# Patient Record
Sex: Female | Born: 1950 | Race: White | Hispanic: No | Smoking: Never smoker
Health system: Southern US, Community
[De-identification: ages and names within clinical notes are randomized; demographics above are authoritative.]

## PROBLEM LIST (undated history)

## (undated) DIAGNOSIS — K219 Gastro-esophageal reflux disease without esophagitis: Secondary | ICD-10-CM

## (undated) DIAGNOSIS — C801 Malignant (primary) neoplasm, unspecified: Secondary | ICD-10-CM

## (undated) DIAGNOSIS — M359 Systemic involvement of connective tissue, unspecified: Secondary | ICD-10-CM

## (undated) DIAGNOSIS — F329 Major depressive disorder, single episode, unspecified: Secondary | ICD-10-CM

## (undated) DIAGNOSIS — R569 Unspecified convulsions: Secondary | ICD-10-CM

## (undated) DIAGNOSIS — E7800 Pure hypercholesterolemia, unspecified: Secondary | ICD-10-CM

## (undated) DIAGNOSIS — F419 Anxiety disorder, unspecified: Secondary | ICD-10-CM

## (undated) DIAGNOSIS — M1990 Unspecified osteoarthritis, unspecified site: Secondary | ICD-10-CM

## (undated) DIAGNOSIS — B1920 Unspecified viral hepatitis C without hepatic coma: Secondary | ICD-10-CM

## (undated) DIAGNOSIS — F32A Depression, unspecified: Secondary | ICD-10-CM

## (undated) DIAGNOSIS — H409 Unspecified glaucoma: Secondary | ICD-10-CM

## (undated) DIAGNOSIS — M797 Fibromyalgia: Secondary | ICD-10-CM

## (undated) DIAGNOSIS — I10 Essential (primary) hypertension: Secondary | ICD-10-CM

## (undated) DIAGNOSIS — E559 Vitamin D deficiency, unspecified: Secondary | ICD-10-CM

## (undated) HISTORY — DX: Pure hypercholesterolemia, unspecified: E78.00

## (undated) HISTORY — DX: Essential (primary) hypertension: I10

## (undated) HISTORY — DX: Unspecified glaucoma: H40.9

## (undated) HISTORY — DX: Gastro-esophageal reflux disease without esophagitis: K21.9

## (undated) HISTORY — DX: Unspecified osteoarthritis, unspecified site: M19.90

## (undated) HISTORY — DX: Major depressive disorder, single episode, unspecified: F32.9

## (undated) HISTORY — DX: Unspecified convulsions: R56.9

## (undated) HISTORY — DX: Vitamin D deficiency, unspecified: E55.9

## (undated) HISTORY — DX: Unspecified viral hepatitis C without hepatic coma: B19.20

## (undated) HISTORY — DX: Fibromyalgia: M79.7

## (undated) HISTORY — PX: CHOLECYSTECTOMY: SHX55

## (undated) HISTORY — PX: TONSILLECTOMY: SHX5217

## (undated) HISTORY — DX: Depression, unspecified: F32.A

## (undated) HISTORY — PX: NOSE SURGERY: SHX723

## (undated) HISTORY — PX: ABDOMINAL HYSTERECTOMY: SHX81

## (undated) HISTORY — DX: Malignant (primary) neoplasm, unspecified: C80.1

## (undated) HISTORY — DX: Anxiety disorder, unspecified: F41.9

---

## 2016-09-17 ENCOUNTER — Other Ambulatory Visit: Payer: Self-pay | Admitting: Internal Medicine

## 2016-09-17 DIAGNOSIS — Z1231 Encounter for screening mammogram for malignant neoplasm of breast: Secondary | ICD-10-CM

## 2016-10-05 ENCOUNTER — Encounter: Payer: Self-pay | Admitting: Radiology

## 2016-10-05 ENCOUNTER — Ambulatory Visit
Admission: RE | Admit: 2016-10-05 | Discharge: 2016-10-05 | Disposition: A | Discharge status: Home / Self Care | Payer: Medicare Other | Source: Ambulatory Visit | Attending: Internal Medicine | Admitting: Internal Medicine

## 2016-10-05 DIAGNOSIS — Z1231 Encounter for screening mammogram for malignant neoplasm of breast: Secondary | ICD-10-CM | POA: Insufficient documentation

## 2016-10-09 ENCOUNTER — Inpatient Hospital Stay
Admission: RE | Admit: 2016-10-09 | Discharge: 2016-10-09 | Disposition: A | Discharge status: Home / Self Care | Payer: Self-pay | Source: Ambulatory Visit | Attending: *Deleted | Admitting: *Deleted

## 2016-10-09 ENCOUNTER — Other Ambulatory Visit: Payer: Self-pay | Admitting: *Deleted

## 2016-10-09 DIAGNOSIS — Z9289 Personal history of other medical treatment: Secondary | ICD-10-CM

## 2017-04-05 ENCOUNTER — Ambulatory Visit (INDEPENDENT_AMBULATORY_CARE_PROVIDER_SITE_OTHER): Payer: Medicare Other | Admitting: Urology

## 2017-04-05 ENCOUNTER — Encounter: Payer: Self-pay | Admitting: Urology

## 2017-04-05 VITALS — BP 128/90 | HR 147 | Ht 66.0 in | Wt 217.0 lb

## 2017-04-05 DIAGNOSIS — R310 Gross hematuria: Secondary | ICD-10-CM | POA: Diagnosis not present

## 2017-04-05 DIAGNOSIS — N200 Calculus of kidney: Secondary | ICD-10-CM | POA: Diagnosis not present

## 2017-04-05 LAB — URINALYSIS, COMPLETE
BILIRUBIN UA: NEGATIVE
GLUCOSE, UA: NEGATIVE
Leukocytes, UA: NEGATIVE
Nitrite, UA: NEGATIVE
RBC, UA: NEGATIVE
Specific Gravity, UA: >1.03 — ABNORMAL HIGH (ref 1.005–1.030)
Urobilinogen, Ur: 0.2 mg/dL (ref 0.2–1.0)
pH, UA: 5.5 (ref 5.0–7.5)

## 2017-04-05 LAB — MICROSCOPIC EXAMINATION
Bacteria, UA: NONE SEEN
RBC, UA: NONE SEEN /hpf (ref 0–?)
WBC, UA: NONE SEEN /hpf (ref 0–?)

## 2017-04-05 NOTE — Progress Notes (Signed)
04/05/2017 10:21 AM   Becky Collier 1950/12/16 [id number]  Referring provider: Glendon Axe, MD [facility] [address] [place], [place] [address]  Chief Complaint  Patient presents with  . Hematuria    HPI: The patient is a 67 year old female with past medical history of nephrolithiasis requiring multiple stone related surgery since 2004 presents today to discuss this history of kidney stones.  Approximately 3 weeks ago, the patient had significant gross hematuria as well as right flank pain.  She thinks she may have been passing a stone.  She had no imaging at that time.  She never did see the stone.  She has had multiple ureteroscopic intervention since 2004.  It is been sometime though since her last one.  She also did experiences gross hematuria which is concerning.  This is not common for her.  This has since resolved.  Urinalysis today is without blood but does have calcium oxalate crystals.  She has never smoked.  Stone composition is unknown per patient.  She has never had a metabolic workup despite her frequent stone formation.  She is on Topomax.  PMH: Past Medical History:  Diagnosis Date  . Acid reflux   . Anxiety   . Arthritis   . Cancer (HCC)    cervial  . Depression   . Diabetes mellitus without complication ([place])   . Fibromyalgia   . Glaucoma   . Hepatitis C   . High cholesterol   . Hypertension   . Seizure ([place])   . Vitamin D deficiency     Surgical History: Past Surgical History:  Procedure Laterality Date  . ABDOMINAL HYSTERECTOMY    . CHOLECYSTECTOMY    . NOSE SURGERY    . TONSILLECTOMY      Home Medications:  Allergies as of 04/05/2017      Reactions   Codeine Nausea Only   (CODEINE)   Propoxyphene Other (See Comments)   (DARVOCET-N 50) (DARVOCET-N 50)      Medication List        Accurate as of 04/05/17 10:21 AM. Always use your most recent med list.          amoxicillin 500 MG capsule Commonly known as:   AMOXIL TAKE 4 CAPSULES BY MOUTH 1 HOUR PRIOR TO DENTAL PROCEDURE   cycloSPORINE 0.05 % ophthalmic emulsion Commonly known as:  RESTASIS Apply to eye.   ENBREL 50 MG/ML injection Generic drug:  etanercept   escitalopram 20 MG tablet Commonly known as:  LEXAPRO Take by mouth.   lisinopril 40 MG tablet Commonly known as:  PRINIVIL,ZESTRIL TAKE 1 TABLET EVERY DAY   meclizine 25 MG tablet Commonly known as:  ANTIVERT Take by mouth.   meloxicam 7.5 MG tablet Commonly known as:  MOBIC TAKE 1 TO 2 TABLETS BY MOUTH DAILY AS NEEDED FOR PAIN   pravastatin 40 MG tablet Commonly known as:  PRAVACHOL Take 40 mg by mouth daily.   PRESERVISION AREDS 2+MULTI VIT Caps Take 1 capsule by mouth daily.   sulfaSALAzine 500 MG EC tablet Commonly known as:  AZULFIDINE   tiZANidine 2 MG tablet Commonly known as:  ZANAFLEX Take by mouth.   topiramate 50 MG tablet Commonly known as:  TOPAMAX Take 50 mg by mouth 2 (two) times daily.   traZODone 150 MG tablet Commonly known as:  DESYREL Take 150 mg by mouth at bedtime.   Vitamin D (Ergocalciferol) 50000 units Caps capsule Commonly known as:  DRISDOL TAKE ONE CAPSULE ONCE A WEEK  FOR 12 WEEKS       Allergies:  Allergies  Allergen Reactions  . Codeine Nausea Only    (CODEINE)   . Propoxyphene Other (See Comments)    (DARVOCET-N 5) (DARVOCET-N 50)    Family History: Family History  Problem Relation Age of Onset  . Breast cancer Mother        67's    Social History:  reports that  has never smoked. she has never used smokeless tobacco. She reports that she does not drink alcohol or use drugs.  ROS: UROLOGY Frequent Urination?: Yes Hard to postpone urination?: Yes Burning/pain with urination?: No Get up at night to urinate?: Yes Leakage of urine?: Yes Urine stream starts and stops?: No Trouble starting stream?: Yes Do you have to strain to urinate?: Yes Blood in urine?: No Urinary tract infection?: No Sexually  transmitted disease?: No Injury to kidneys or bladder?: No Painful intercourse?: No Weak stream?: Yes Currently pregnant?: No Vaginal bleeding?: No Last menstrual period?: n  Gastrointestinal Nausea?: Yes Vomiting?: Yes Indigestion/heartburn?: No Diarrhea?: Yes Constipation?: No  Constitutional Fever: Yes Night sweats?: No Weight loss?: Yes Fatigue?: Yes  Skin Skin rash/lesions?: No Itching?: No  Eyes Blurred vision?: Yes Double vision?: No  Ears/Nose/Throat Sore throat?: No Sinus problems?: No  Hematologic/Lymphatic Swollen glands?: Yes Easy bruising?: No  Cardiovascular Leg swelling?: No Chest pain?: No  Respiratory Cough?: Yes Shortness of breath?: No  Endocrine Excessive thirst?: Yes  Musculoskeletal Back pain?: Yes Joint pain?: Yes  Neurological Headaches?: Yes Dizziness?: Yes  Psychologic Depression?: Yes Anxiety?: Yes  Physical Exam: BP 128/90   Pulse (!) 147   Ht 5\' 6"  (1.676 m)   Wt 217 lb (98.4 kg)   BMI 35.02 kg/m   Constitutional:  Alert and oriented, No acute distress. HEENT: [place] AT, moist mucus membranes.  Trachea midline, no masses. Cardiovascular: No clubbing, cyanosis, or edema. Respiratory: Normal respiratory effort, no increased work of breathing. GI: Abdomen is soft, nontender, nondistended, no abdominal masses GU: No CVA tenderness.  Skin: No rashes, bruises or suspicious lesions. Lymph: No cervical or inguinal adenopathy. Neurologic: Grossly intact, no focal deficits, moving all 4 extremities. Psychiatric: Normal mood and affect.  Laboratory Data: No results found for: WBC, HGB, HCT, MCV, PLT  No results found for: CREATININE  No results found for: PSA  No results found for: TESTOSTERONE  No results found for: HGBA1C  Urinalysis No results found for: COLORURINE, APPEARANCEUR, LABSPEC, PHURINE, GLUCOSEU, HGBUR, BILIRUBINUR, KETONESUR, PROTEINUR, UROBILINOGEN, NITRITE, LEUKOCYTESUR   Assessment & Plan:     1. Gross hematuria I discussed the patient that she should undergo formal gross hematuria workup as she did have this.  Though she was having a possible stone passage that time, the fact that she never saw a stone concerns me.  We will arrange for her to undergo a CT hematuria protocol followed by office cystoscopy.    2.  History of nephrolithiasis She does have calcium oxalate in her urine today.  If we can determine her stone type, she would benefit from a 24-hour urine study and metabolic workup.  The CT for above will help [place] assess her current stone burden if any.  I did discuss the patient that Topamax puts her at risk for kidney stones.   I encouraged her to discuss this medication with the prescribing physician to see if there is an alternative as this is likely contributing to her recurrent stone formation.  Return for after CT for cysto.  Aaron Edelman  Harolyn Rutherford, MD  [facility] [address], [place], [place] [address] [phone]

## 2017-04-16 ENCOUNTER — Ambulatory Visit
Admission: RE | Admit: 2017-04-16 | Discharge: 2017-04-16 | Disposition: A | Discharge status: Home / Self Care | Payer: Medicare Other | Source: Ambulatory Visit | Attending: Urology | Admitting: Urology

## 2017-04-16 DIAGNOSIS — I2510 Atherosclerotic heart disease of native coronary artery without angina pectoris: Secondary | ICD-10-CM | POA: Insufficient documentation

## 2017-04-16 DIAGNOSIS — K5730 Diverticulosis of large intestine without perforation or abscess without bleeding: Secondary | ICD-10-CM | POA: Insufficient documentation

## 2017-04-16 DIAGNOSIS — N281 Cyst of kidney, acquired: Secondary | ICD-10-CM | POA: Diagnosis not present

## 2017-04-16 DIAGNOSIS — M47816 Spondylosis without myelopathy or radiculopathy, lumbar region: Secondary | ICD-10-CM | POA: Diagnosis not present

## 2017-04-16 DIAGNOSIS — N200 Calculus of kidney: Secondary | ICD-10-CM | POA: Insufficient documentation

## 2017-04-16 DIAGNOSIS — R310 Gross hematuria: Secondary | ICD-10-CM | POA: Diagnosis not present

## 2017-04-16 HISTORY — DX: Systemic involvement of connective tissue, unspecified: M35.9

## 2017-04-16 LAB — POCT I-STAT CREATININE: Creatinine, Ser: 0.9 mg/dL (ref 0.44–1.00)

## 2017-04-16 MED ORDER — IOPAMIDOL (ISOVUE-300) INJECTION 61%
125.0000 mL | Freq: Once | INTRAVENOUS | Status: AC | PRN
  Administered 2017-04-16: 125 mL via INTRAVENOUS

## 2017-04-19 ENCOUNTER — Ambulatory Visit (INDEPENDENT_AMBULATORY_CARE_PROVIDER_SITE_OTHER): Payer: Medicare Other | Admitting: Urology

## 2017-04-19 ENCOUNTER — Encounter: Payer: Self-pay | Admitting: Urology

## 2017-04-19 VITALS — BP 120/76 | HR 68 | Ht 66.0 in | Wt 217.1 lb

## 2017-04-19 DIAGNOSIS — R310 Gross hematuria: Secondary | ICD-10-CM | POA: Diagnosis not present

## 2017-04-19 DIAGNOSIS — N200 Calculus of kidney: Secondary | ICD-10-CM

## 2017-04-19 LAB — URINALYSIS, COMPLETE
Bilirubin, UA: NEGATIVE
GLUCOSE, UA: NEGATIVE
KETONES UA: NEGATIVE
Leukocytes, UA: NEGATIVE
NITRITE UA: NEGATIVE
RBC, UA: NEGATIVE
UUROB: 0.2 mg/dL (ref 0.2–1.0)
pH, UA: 5 (ref 5.0–7.5)

## 2017-04-19 LAB — MICROSCOPIC EXAMINATION
RBC MICROSCOPIC, UA: NONE SEEN /HPF (ref 0–?)
WBC UA: NONE SEEN /HPF (ref 0–?)

## 2017-04-19 MED ORDER — LIDOCAINE HCL 2 % EX GEL
1.0000 "application " | Freq: Once | CUTANEOUS | Status: AC
  Administered 2017-04-19: 1 via URETHRAL

## 2017-04-19 MED ORDER — CIPROFLOXACIN HCL 500 MG PO TABS
500.0000 mg | ORAL_TABLET | Freq: Once | ORAL | Status: AC
  Administered 2017-04-19: 500 mg via ORAL

## 2017-04-19 NOTE — Progress Notes (Signed)
   04/19/17  CC:  Chief Complaint  Patient presents with  . Cysto    HPI: The patient is a 67 year old female with past medical history of nephrolithiasis requiring multiple stone related surgery since 2004 presents today to discuss this history of kidney stones.  Approximately 3 weeks ago, the patient had significant gross hematuria as well as right flank pain.  She thinks she may have been passing a stone.  She had no imaging at that time.  She never did see the stone.  She has had multiple ureteroscopic intervention since 2004.  It is been sometime though since her last one.  She also did experiences gross hematuria which is concerning.  This is not common for her.  This has since resolved.  Urinalysis today is without blood but does have calcium oxalate crystals.  She has never smoked.  Stone composition is unknown per patient.  She has never had a metabolic workup despite her frequent stone formation.  She is on Topomax.  The patient also complains of vague right flank pain at this time.  This is been here for a number of months.  She  is concerned that her known right renal cyst as a source of this.  Due to her gross hematuria which she does not typically have a stone passes, she underwent a CT hematuria protocol.  This showed a 3 mm left upper pole stone that was nonobstructing.  It also showed a 1.7 cm right benign lower pole cyst.  There were no vitals taken for this visit. NED. A&Ox3.   No respiratory distress   Abd soft, NT, ND Normal external genitalia with patent urethral meatus  Cystoscopy Procedure Note  Patient identification was confirmed, informed consent was obtained, and patient was prepped using Betadine solution.  Lidocaine jelly was administered per urethral meatus.    Preoperative abx where received prior to procedure.    Procedure: - Flexible cystoscope introduced, without any difficulty.   - Thorough search of the bladder revealed:    normal urethral  meatus    normal urothelium    no stones    no ulcers     no tumors    no urethral polyps    no trabeculation  - Ureteral orifices were normal in position and appearance.  Post-Procedure: - Patient tolerated the procedure well  Assessment/ Plan:  1.  Gross hematuria Negative workup except for 3 mm left upper pole stone. Likely from her recent stone passage.  2.  3 mm left upper pole nonobstructing stone This is a small stone.  We will treated conservatively for now.  3.  Recurrent nephrolithiasis Once we know her stone composition, the patient would benefit from a 24-hour urine study.  We will attempt to obtain a stone for stone analysis if she passes one in the future.  4.  Right flank pain I reassured the patient that her flank pain is not related to her very small renal cyst this is a very common incidental finding that is nonpainful.  Do not believe this discomfort be genitourinary in origin.  Favor musculoskeletal.  Nickie Retort, MD

## 2017-10-10 ENCOUNTER — Other Ambulatory Visit: Payer: Self-pay | Admitting: Internal Medicine

## 2017-10-10 DIAGNOSIS — Z1231 Encounter for screening mammogram for malignant neoplasm of breast: Secondary | ICD-10-CM

## 2017-10-25 ENCOUNTER — Ambulatory Visit
Admission: RE | Admit: 2017-10-25 | Discharge: 2017-10-25 | Disposition: A | Discharge status: Home / Self Care | Payer: Medicare Other | Source: Ambulatory Visit | Attending: Internal Medicine | Admitting: Internal Medicine

## 2017-10-25 DIAGNOSIS — Z1231 Encounter for screening mammogram for malignant neoplasm of breast: Secondary | ICD-10-CM | POA: Diagnosis not present

## 2018-10-20 ENCOUNTER — Other Ambulatory Visit: Payer: Self-pay | Admitting: Internal Medicine

## 2018-10-20 DIAGNOSIS — Z1231 Encounter for screening mammogram for malignant neoplasm of breast: Secondary | ICD-10-CM

## 2018-11-25 ENCOUNTER — Ambulatory Visit
Admission: RE | Admit: 2018-11-25 | Discharge: 2018-11-25 | Disposition: A | Discharge status: Home / Self Care | Payer: Medicare Other | Source: Ambulatory Visit | Attending: Internal Medicine | Admitting: Internal Medicine

## 2018-11-25 DIAGNOSIS — Z1231 Encounter for screening mammogram for malignant neoplasm of breast: Secondary | ICD-10-CM | POA: Insufficient documentation

## 2019-03-30 ENCOUNTER — Other Ambulatory Visit: Payer: Self-pay | Admitting: Internal Medicine

## 2019-03-30 ENCOUNTER — Ambulatory Visit
Admission: RE | Admit: 2019-03-30 | Discharge: 2019-03-30 | Disposition: A | Discharge status: Home / Self Care | Payer: Medicare Other | Source: Ambulatory Visit | Attending: Internal Medicine | Admitting: Internal Medicine

## 2019-03-30 ENCOUNTER — Other Ambulatory Visit
Admission: RE | Admit: 2019-03-30 | Discharge: 2019-03-30 | Disposition: A | Discharge status: Home / Self Care | Payer: Medicare Other | Source: Ambulatory Visit | Attending: Internal Medicine | Admitting: Internal Medicine

## 2019-03-30 ENCOUNTER — Other Ambulatory Visit: Payer: Self-pay

## 2019-03-30 ENCOUNTER — Encounter (INDEPENDENT_AMBULATORY_CARE_PROVIDER_SITE_OTHER): Payer: Self-pay

## 2019-03-30 DIAGNOSIS — R600 Localized edema: Secondary | ICD-10-CM

## 2019-03-30 DIAGNOSIS — R7989 Other specified abnormal findings of blood chemistry: Secondary | ICD-10-CM

## 2019-03-30 DIAGNOSIS — R079 Chest pain, unspecified: Secondary | ICD-10-CM | POA: Insufficient documentation

## 2019-03-30 DIAGNOSIS — N1832 Chronic kidney disease, stage 3b: Secondary | ICD-10-CM

## 2019-03-30 LAB — FIBRIN DERIVATIVES D-DIMER (ARMC ONLY): Fibrin derivatives D-dimer (ARMC): 1618.82 ng/mL (FEU) — ABNORMAL HIGH (ref 0.00–499.00)

## 2019-03-30 MED ORDER — IOHEXOL 350 MG/ML SOLN
60.0000 mL | Freq: Once | INTRAVENOUS | Status: AC | PRN
  Administered 2019-03-30: 60 mL via INTRAVENOUS

## 2019-03-31 ENCOUNTER — Other Ambulatory Visit: Payer: Self-pay | Admitting: Internal Medicine

## 2019-03-31 DIAGNOSIS — R079 Chest pain, unspecified: Secondary | ICD-10-CM

## 2019-03-31 DIAGNOSIS — R7989 Other specified abnormal findings of blood chemistry: Secondary | ICD-10-CM

## 2019-04-02 ENCOUNTER — Other Ambulatory Visit
Admission: RE | Admit: 2019-04-02 | Discharge: 2019-04-02 | Disposition: A | Discharge status: Home / Self Care | Payer: Medicare Other | Source: Ambulatory Visit | Attending: Internal Medicine | Admitting: Internal Medicine

## 2019-04-02 ENCOUNTER — Ambulatory Visit
Admission: RE | Admit: 2019-04-02 | Discharge: 2019-04-02 | Disposition: A | Discharge status: Home / Self Care | Payer: Medicare Other | Source: Ambulatory Visit | Attending: Internal Medicine | Admitting: Internal Medicine

## 2019-04-02 ENCOUNTER — Other Ambulatory Visit: Payer: Self-pay

## 2019-04-02 DIAGNOSIS — R079 Chest pain, unspecified: Secondary | ICD-10-CM | POA: Insufficient documentation

## 2019-04-02 DIAGNOSIS — N1832 Chronic kidney disease, stage 3b: Secondary | ICD-10-CM

## 2019-04-02 DIAGNOSIS — R7989 Other specified abnormal findings of blood chemistry: Secondary | ICD-10-CM | POA: Diagnosis present

## 2019-04-02 LAB — CREATININE, SERUM
Creatinine, Ser: 1.13 mg/dL — ABNORMAL HIGH (ref 0.44–1.00)
GFR calc Af Amer: 57 mL/min — ABNORMAL LOW (ref >60)
GFR calc non Af Amer: 49 mL/min — ABNORMAL LOW (ref >60)

## 2019-04-02 MED ORDER — IOHEXOL 350 MG/ML SOLN
75.0000 mL | Freq: Once | INTRAVENOUS | Status: AC | PRN
  Administered 2019-04-02: 75 mL via INTRAVENOUS

## 2019-04-03 ENCOUNTER — Ambulatory Visit: Payer: Federal, State, Local not specified - PPO

## 2019-10-26 ENCOUNTER — Other Ambulatory Visit: Payer: Self-pay | Admitting: Internal Medicine

## 2019-10-26 DIAGNOSIS — Z1231 Encounter for screening mammogram for malignant neoplasm of breast: Secondary | ICD-10-CM

## 2019-11-26 ENCOUNTER — Ambulatory Visit
Admission: RE | Admit: 2019-11-26 | Discharge: 2019-11-26 | Disposition: A | Discharge status: Home / Self Care | Payer: Medicare Other | Source: Ambulatory Visit | Attending: Internal Medicine | Admitting: Internal Medicine

## 2019-11-26 ENCOUNTER — Other Ambulatory Visit: Payer: Self-pay

## 2019-11-26 DIAGNOSIS — Z1231 Encounter for screening mammogram for malignant neoplasm of breast: Secondary | ICD-10-CM | POA: Insufficient documentation

## 2020-05-20 ENCOUNTER — Other Ambulatory Visit: Payer: Self-pay

## 2020-05-20 ENCOUNTER — Ambulatory Visit: Payer: Medicare Other | Attending: Internal Medicine

## 2020-05-20 DIAGNOSIS — Z23 Encounter for immunization: Secondary | ICD-10-CM

## 2020-05-20 MED ORDER — PFIZER-BIONT COVID-19 VAC-TRIS 30 MCG/0.3ML IM SUSP
INTRAMUSCULAR | 0 refills | Status: AC
  Filled 2020-05-20: qty 0.3, 1d supply, fill #0

## 2020-05-20 NOTE — Progress Notes (Signed)
   Covid-19 Vaccination Clinic  Name:  Becky Collier    MRN: [id number] DOB: April 26, 1950  05/20/2020  Becky Collier was observed post Covid-19 immunization for 15 minutes without incident. She was provided with Vaccine Information Sheet and instruction to access the V-Safe system.   Becky Collier was instructed to call 911 with any severe reactions post vaccine: Marland Kitchen Difficulty breathing  . Swelling of face and throat  . A fast heartbeat  . A bad rash all over body  . Dizziness and weakness   Immunizations Administered    Name Date Dose VIS Date Route   PFIZER Comrnaty(Gray TOP) Covid-19 Vaccine 05/20/2020 11:01 AM 0.3 mL 01/14/2020 Intramuscular   Manufacturer: [employer]   Lot: [id number]   [place]: [phone]

## 2020-08-17 ENCOUNTER — Ambulatory Visit: Payer: Medicare Other | Admitting: Anesthesiology

## 2020-08-17 ENCOUNTER — Encounter
Admission: RE | Disposition: A | Discharge status: Home / Self Care | Payer: Self-pay | Source: Home / Self Care | Attending: Internal Medicine

## 2020-08-17 ENCOUNTER — Encounter: Payer: Self-pay | Admitting: Internal Medicine

## 2020-08-17 ENCOUNTER — Ambulatory Visit
Admission: RE | Admit: 2020-08-17 | Discharge: 2020-08-17 | Disposition: A | Discharge status: Home / Self Care | Payer: Medicare Other | Attending: Internal Medicine | Admitting: Internal Medicine

## 2020-08-17 ENCOUNTER — Other Ambulatory Visit: Payer: Self-pay

## 2020-08-17 DIAGNOSIS — K219 Gastro-esophageal reflux disease without esophagitis: Secondary | ICD-10-CM | POA: Insufficient documentation

## 2020-08-17 DIAGNOSIS — E1122 Type 2 diabetes mellitus with diabetic chronic kidney disease: Secondary | ICD-10-CM | POA: Diagnosis not present

## 2020-08-17 DIAGNOSIS — K5730 Diverticulosis of large intestine without perforation or abscess without bleeding: Secondary | ICD-10-CM | POA: Insufficient documentation

## 2020-08-17 DIAGNOSIS — Z791 Long term (current) use of non-steroidal anti-inflammatories (NSAID): Secondary | ICD-10-CM | POA: Diagnosis not present

## 2020-08-17 DIAGNOSIS — D124 Benign neoplasm of descending colon: Secondary | ICD-10-CM | POA: Insufficient documentation

## 2020-08-17 DIAGNOSIS — Z7982 Long term (current) use of aspirin: Secondary | ICD-10-CM | POA: Insufficient documentation

## 2020-08-17 DIAGNOSIS — K641 Second degree hemorrhoids: Secondary | ICD-10-CM | POA: Diagnosis not present

## 2020-08-17 DIAGNOSIS — I129 Hypertensive chronic kidney disease with stage 1 through stage 4 chronic kidney disease, or unspecified chronic kidney disease: Secondary | ICD-10-CM | POA: Insufficient documentation

## 2020-08-17 DIAGNOSIS — Z79899 Other long term (current) drug therapy: Secondary | ICD-10-CM | POA: Diagnosis not present

## 2020-08-17 DIAGNOSIS — Z6833 Body mass index (BMI) 33.0-33.9, adult: Secondary | ICD-10-CM | POA: Diagnosis not present

## 2020-08-17 DIAGNOSIS — K621 Rectal polyp: Secondary | ICD-10-CM | POA: Diagnosis not present

## 2020-08-17 DIAGNOSIS — N184 Chronic kidney disease, stage 4 (severe): Secondary | ICD-10-CM | POA: Diagnosis not present

## 2020-08-17 DIAGNOSIS — Z885 Allergy status to narcotic agent status: Secondary | ICD-10-CM | POA: Diagnosis not present

## 2020-08-17 DIAGNOSIS — Z1211 Encounter for screening for malignant neoplasm of colon: Secondary | ICD-10-CM | POA: Insufficient documentation

## 2020-08-17 HISTORY — PX: COLONOSCOPY WITH PROPOFOL: SHX5780

## 2020-08-17 SURGERY — COLONOSCOPY WITH PROPOFOL
Anesthesia: General

## 2020-08-17 MED ORDER — EPHEDRINE SULFATE 50 MG/ML IJ SOLN
INTRAMUSCULAR | Status: DC | PRN
  Administered 2020-08-17: 10 mg via INTRAVENOUS

## 2020-08-17 MED ORDER — PROPOFOL 500 MG/50ML IV EMUL
INTRAVENOUS | Status: DC | PRN
  Administered 2020-08-17: 120 ug/kg/min via INTRAVENOUS

## 2020-08-17 MED ORDER — SODIUM CHLORIDE 0.9 % IV SOLN: INTRAVENOUS | Status: DC

## 2020-08-17 MED ORDER — EPHEDRINE 5 MG/ML INJ
INTRAVENOUS | Status: AC
  Filled 2020-08-17: qty 10

## 2020-08-17 MED ORDER — PROPOFOL 500 MG/50ML IV EMUL
INTRAVENOUS | Status: AC
  Filled 2020-08-17: qty 50

## 2020-08-17 NOTE — H&P (Signed)
Outpatient short stay form Pre-procedure 08/17/2020 10:47 AM [place] K. Alice Reichert, M.D.  Primary Physician: Frazier Richards, M.D.  Reason for visit:  Personal history of adenomatous colon polyps - 2014 - [place], [place]  History of present illness:                            Patient presents for colonoscopy for a personal hx of colon polyps. The patient denies abdominal pain, abnormal weight loss or rectal bleeding.      Current Facility-Administered Medications:    0.9 %  sodium chloride infusion, , Intravenous, Continuous, [place], Benay Pike, MD, Last Rate: 20 mL/hr at 08/17/20 1042, New Bag at 08/17/20 1042  Medications Prior to Admission  Medication Sig Dispense Refill Last Dose   aspirin EC 81 MG tablet Take 81 mg by mouth daily. Swallow whole.   Past Week   Calcium Carbonate-Vit D-Min (CALCIUM 1200 PO) Take by mouth.   Past Week   Certolizumab Pegol (CIMZIA [place]) Inject into the skin.   Past Month   cycloSPORINE (RESTASIS) 0.05 % ophthalmic emulsion Apply to eye.   08/16/2020   escitalopram (LEXAPRO) 20 MG tablet Take by mouth.   08/16/2020   gabapentin (NEURONTIN) 100 MG capsule Take 100 mg by mouth 3 (three) times daily.   08/16/2020   hydroxychloroquine (PLAQUENIL) 200 MG tablet Take by mouth daily.   Past Week   lisinopril (PRINIVIL,ZESTRIL) 40 MG tablet TAKE 1 TABLET EVERY DAY   08/16/2020   meclizine (ANTIVERT) 25 MG tablet Take by mouth.   Past Week   meloxicam (MOBIC) 7.5 MG tablet TAKE 1 TO 2 TABLETS BY MOUTH DAILY AS NEEDED FOR PAIN  2 08/16/2020   Multiple Vitamins-Minerals (PRESERVISION AREDS 2+MULTI VIT) CAPS Take 1 capsule by mouth daily.   Past Week   rosuvastatin (CRESTOR) 20 MG tablet Take 20 mg by mouth daily.   08/16/2020   topiramate (TOPAMAX) 50 MG tablet Take 50 mg by mouth 2 (two) times daily.   08/16/2020   traZODone (DESYREL) 150 MG tablet Take 150 mg by mouth at bedtime.   08/16/2020   vitamin B-12 (CYANOCOBALAMIN) 1000 MCG tablet Take 1,000 mcg by mouth daily.    Past Week   Vitamin D, Ergocalciferol, (DRISDOL) 50000 units CAPS capsule TAKE ONE CAPSULE ONCE A WEEK FOR 12 WEEKS  1 Past Week   amoxicillin (AMOXIL) 500 MG capsule TAKE 4 CAPSULES BY MOUTH 1 HOUR PRIOR TO DENTAL PROCEDURE      COVID-19 mRNA Vac-TriS, Pfizer, (PFIZER-BIONT COVID-19 VAC-TRIS) SUSP injection Inject into the muscle. 0.3 mL 0    ENBREL 50 MG/ML injection  (Patient not taking: Reported on 08/17/2020)   Not Taking   pravastatin (PRAVACHOL) 40 MG tablet Take 40 mg by mouth daily. (Patient not taking: Reported on 08/17/2020)  3 Not Taking   sulfaSALAzine (AZULFIDINE) 500 MG EC tablet  (Patient not taking: Reported on 08/17/2020)   Not Taking   tiZANidine (ZANAFLEX) 2 MG tablet Take by mouth. (Patient not taking: Reported on 08/17/2020)   Not Taking     Allergies  Allergen Reactions   Codeine Nausea And Vomiting and Rash    (CODEINE)    Propoxyphene Nausea And Vomiting, Rash and Other (See Comments)    (DARVOCET-N 50) (DARVOCET-N 50)     Past Medical History:  Diagnosis Date   Acid reflux    Anxiety    Arthritis    Cancer ([place])    cervial  Collagen vascular disease (HCC)    Depression    Fibromyalgia    Glaucoma    Hepatitis C    High cholesterol    Hypertension    Seizure ([place])    Vitamin D deficiency     Review of systems:  Otherwise negative.    Physical Exam  Gen: Alert, oriented. Appears stated age.  HEENT: [place]/AT. PERRLA. Lungs: CTA, no wheezes. CV: RR nl S1, S2. Abd: soft, benign, no masses. BS+ Ext: No edema. Pulses 2+    Planned procedures: Proceed with colonoscopy. The patient understands the nature of the planned procedure, indications, risks, alternatives and potential complications including but not limited to bleeding, infection, perforation, damage to internal organs and possible oversedation/side effects from anesthesia. The patient agrees and gives consent to proceed.  Please refer to procedure notes for findings, recommendations and  patient disposition/instructions.     Daemien Fronczak K. Alice Reichert, M.D. Gastroenterology 08/17/2020  10:47 AM

## 2020-08-17 NOTE — Anesthesia Postprocedure Evaluation (Signed)
Anesthesia Post Note  Patient: Becky Collier  Procedure(s) Performed: COLONOSCOPY WITH PROPOFOL  Patient location during evaluation: Phase II Anesthesia Type: General Level of consciousness: awake and alert, awake and oriented Pain management: pain level controlled Vital Signs Assessment: post-procedure vital signs reviewed and stable Respiratory status: spontaneous breathing, nonlabored ventilation and respiratory function stable Cardiovascular status: blood pressure returned to baseline and stable Postop Assessment: no apparent nausea or vomiting Anesthetic complications: no   No notable events documented.   Last Vitals:  Vitals:   08/17/20 1157 08/17/20 1227  BP: (!) 99/46   Pulse: 66   Resp:    Temp: (!) 36.1 C   SpO2: 99% 98%    Last Pain:  Vitals:   08/17/20 1227  TempSrc:   PainSc: Asleep                 Phill Mutter

## 2020-08-17 NOTE — Interval H&P Note (Signed)
History and Physical Interval Note:  08/17/2020 10:48 AM  Becky Collier  has presented today for surgery, with the diagnosis of PERSONAL HX.OF COLON POLYPS.  The various methods of treatment have been discussed with the patient and family. After consideration of risks, benefits and other options for treatment, the patient has consented to  Procedure(s): COLONOSCOPY WITH PROPOFOL (N/A) as a surgical intervention.  The patient's history has been reviewed, patient examined, no change in status, stable for surgery.  I have reviewed the patient's chart and labs.  Questions were answered to the patient's satisfaction.     [place], [place]

## 2020-08-17 NOTE — Transfer of Care (Signed)
Immediate Anesthesia Transfer of Care Note  Patient: Becky Collier  Procedure(s) Performed: COLONOSCOPY WITH PROPOFOL  Patient Location: PACU  Anesthesia Type:General  Level of Consciousness: awake and sedated  Airway & Oxygen Therapy: Patient Spontanous Breathing and Patient connected to nasal cannula oxygen  Post-op Assessment: Report given to RN and Post -op Vital signs reviewed and stable  Post vital signs: Reviewed and stable  Last Vitals:  Vitals Value Taken Time  BP    Temp    Pulse    Resp    SpO2      Last Pain:  Vitals:   08/17/20 1015  TempSrc: Temporal  PainSc: 0-No pain         Complications: No notable events documented.

## 2020-08-17 NOTE — Anesthesia Preprocedure Evaluation (Signed)
Anesthesia Evaluation  Patient identified by MRN, date of birth, ID band Patient awake    Reviewed: Allergy & Precautions, NPO status , Patient's Chart, lab work & pertinent test results  Airway Mallampati: II  TM Distance: >3 FB Neck ROM: Full    Dental  (+) Poor Dentition, Loose,    Pulmonary neg pulmonary ROS,    Pulmonary exam normal        Cardiovascular hypertension, Normal cardiovascular exam+ dysrhythmias (Bradycardia)      Neuro/Psych Seizures -, Well Controlled,  PSYCHIATRIC DISORDERS Anxiety Depression  Neuromuscular disease    GI/Hepatic Bowel prep,GERD  ,(+) Hepatitis -, C  Endo/Other  diabetes, Well ControlledMorbid obesity  Renal/GU Stage 4 chronic kidney disease  negative genitourinary   Musculoskeletal  (+) Arthritis , Osteoarthritis,  Fibromyalgia -  Abdominal   Peds negative pediatric ROS (+)  Hematology negative hematology ROS (+)   Anesthesia Other Findings . Abnormal cytology 1981  . Age-related osteoporosis without current pathological fracture  . Allergic state Codeine  . Anxiety 2000  . Arthritis  . Cancer (CMS-HCC) 1981  Cervical Cancer  . Carpal tunnel syndrome  . Carpal tunnel syndrome  . Cataract cortical, senile 2005  . Depression 2000  . Diabetes mellitus type 2, uncomplicated (CMS-HCC)  . Enthesopathy of hip region  . Fracture of leg  . Glaucoma (increased eye pressure)  . Hepatitis C without hepatic coma  . History of abnormal cervical Pap smear 1981  . Hyperlipidemia 2000  . Hypertension 2000  . Kidney stones  . Psoriasis  . Seizures (CMS-HCC) Child  . Severe obesity (CMS-HCC)  . Stage 4 chronic kidney disease (CMS-HCC)  . Vertigo   Reproductive/Obstetrics negative OB ROS                            Anesthesia Physical Anesthesia Plan  ASA: 3  Anesthesia Plan: General   Post-op Pain Management:    Induction: Intravenous  PONV Risk  Score and Plan: 2 and Propofol infusion and TIVA  Airway Management Planned: Natural Airway and Nasal Cannula  Additional Equipment:   Intra-op Plan:   Post-operative Plan:   Informed Consent: I have reviewed the patients History and Physical, chart, labs and discussed the procedure including the risks, benefits and alternatives for the proposed anesthesia with the patient or authorized representative who has indicated his/her understanding and acceptance.       Plan Discussed with: CRNA, Anesthesiologist and Surgeon  Anesthesia Plan Comments:         Anesthesia Quick Evaluation

## 2020-08-17 NOTE — Op Note (Signed)
[facility] Gastroenterology Patient Name: Becky Collier Procedure Date: 08/17/2020 11:24 AM MRN: [id number] Account #: [id number] Date of Birth: [birth year] Admit Type: Outpatient Age: 70 Room: [facility] ENDO ROOM 4 Gender: Female Note Status: Finalized Procedure:             Colonoscopy Indications:           Surveillance: Personal history of adenomatous polyps                         on last colonoscopy > 5 years ago Providers:             Lorie Apley K. Alice Reichert MD, MD Referring MD:          Ocie Cornfield. Ouida Sills MD, MD (Referring MD) Medicines:             Propofol per Anesthesia Complications:         No immediate complications. Procedure:             Pre-Anesthesia Assessment:                        - The risks and benefits of the procedure and the                         sedation options and risks were discussed with the                         patient. All questions were answered and informed                         consent was obtained.                        - Patient identification and proposed procedure were                         verified prior to the procedure by the nurse. The                         procedure was verified in the procedure room.                        - ASA Grade Assessment: III - A patient with severe                         systemic disease.                        - After reviewing the risks and benefits, the patient                         was deemed in satisfactory condition to undergo the                         procedure.                        After obtaining informed consent, the colonoscope was                         passed under  direct vision. Throughout the procedure,                         the patient's blood pressure, pulse, and oxygen                         saturations were monitored continuously. The                         Colonoscope was introduced through the anus and                         advanced to the the cecum,  identified by appendiceal                         orifice and ileocecal valve. The colonoscopy was                         performed without difficulty. The patient tolerated                         the procedure well. The quality of the bowel                         preparation was adequate. The ileocecal valve,                         appendiceal orifice, and rectum were photographed. Findings:      The perianal exam findings include internal hemorrhoids that prolapse       with straining, but spontaneously regress to the resting position (Grade       II).      Non-bleeding internal hemorrhoids were found during retroflexion. The       hemorrhoids were Grade II (internal hemorrhoids that prolapse but reduce       spontaneously).      Many small and large-mouthed diverticula were found in the entire colon.       There was no evidence of diverticular bleeding.      Two sessile polyps were found in the rectum and descending colon. The       polyps were 4 to 5 mm in size. These polyps were removed with a jumbo       cold forceps. Resection and retrieval were complete.      The exam was otherwise without abnormality. Impression:            - Internal hemorrhoids that prolapse with straining,                         but spontaneously regress to the resting position                         (Grade II) found on perianal exam.                        - Non-bleeding internal hemorrhoids.                        - Mild diverticulosis in the entire examined colon.  There was no evidence of diverticular bleeding.                        - Two 4 to 5 mm polyps in the rectum and in the                         descending colon, removed with a jumbo cold forceps.                         Resected and retrieved.                        - The examination was otherwise normal. Recommendation:        - Patient has a contact number available for                         emergencies. The  signs and symptoms of potential                         delayed complications were discussed with the patient.                         Return to normal activities tomorrow. Written                         discharge instructions were provided to the patient.                        - Resume previous diet.                        - Continue present medications.                        - Repeat colonoscopy in 5 years for surveillance.                        - Return to GI office PRN.                        - Await pathology results.                        - The findings and recommendations were discussed with                         the patient. Procedure Code(s):     --- Professional ---                        [phone], Colonoscopy, flexible; with biopsy, single or                         multiple Diagnosis Code(s):     --- Professional ---                        K57.30, Diverticulosis of large intestine without                         perforation or abscess without bleeding  K63.5, Polyp of colon                        K62.1, Rectal polyp                        K64.1, Second degree hemorrhoids                        Z86.010, Personal history of colonic polyps CPT copyright 2019 [employer]. All rights reserved. The codes documented in this report are preliminary and upon coder review may  be revised to meet current compliance requirements. Efrain Sella MD, MD 08/17/2020 12:00:05 PM This report has been signed electronically. Number of Addenda: 0 Note Initiated On: 08/17/2020 11:24 AM Scope Withdrawal Time: 0 hours 5 minutes 46 seconds  Total Procedure Duration: 0 hours 14 minutes 12 seconds  Estimated Blood Loss:  Estimated blood loss: none. Estimated blood loss: none.      [facility]

## 2020-08-18 ENCOUNTER — Encounter: Payer: Self-pay | Admitting: Internal Medicine

## 2020-08-18 LAB — SURGICAL PATHOLOGY

## 2020-10-20 ENCOUNTER — Other Ambulatory Visit: Payer: Self-pay | Admitting: Internal Medicine

## 2020-10-20 DIAGNOSIS — Z1231 Encounter for screening mammogram for malignant neoplasm of breast: Secondary | ICD-10-CM

## 2020-12-07 IMAGING — MG DIGITAL SCREENING BILAT W/ TOMO W/ CAD
6 of 10 series · 6 of 30 positions shown · non-contrast
Comparison: Previous exam(s).

CLINICAL DATA: Screening.

EXAM:
DIGITAL SCREENING BILATERAL MAMMOGRAM WITH TOMO AND CAD

[R MLO synth-2D]
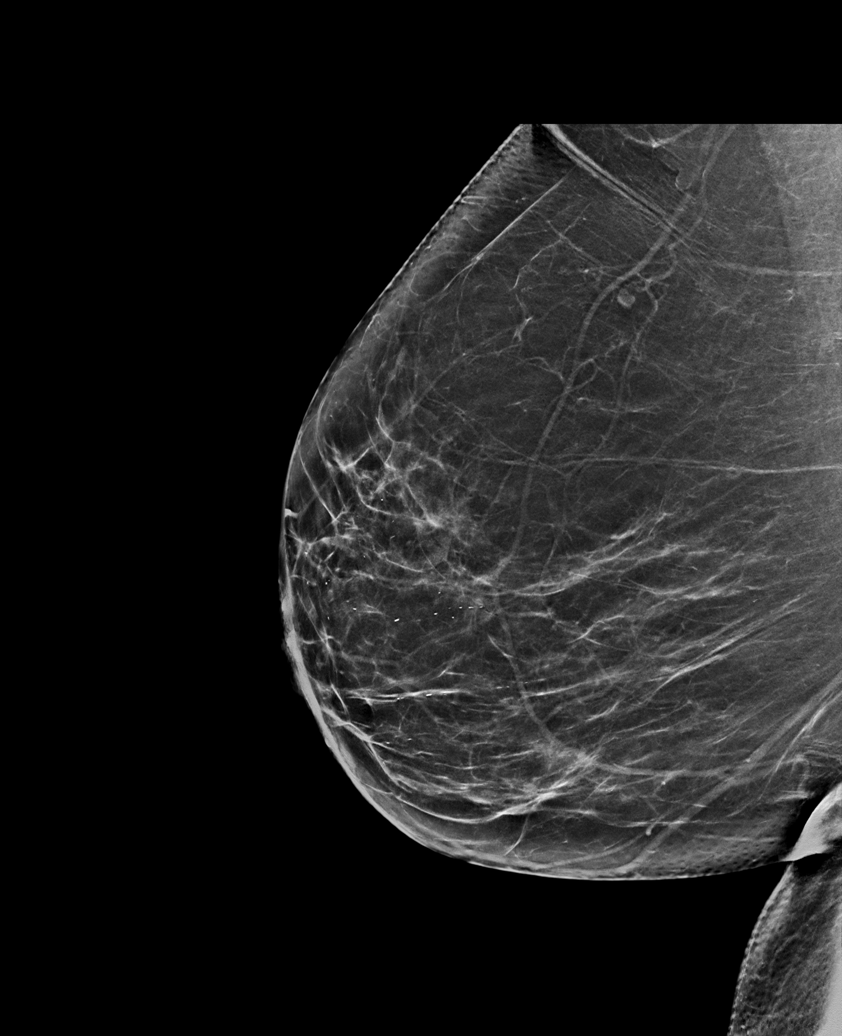

[L CC synth-2D]
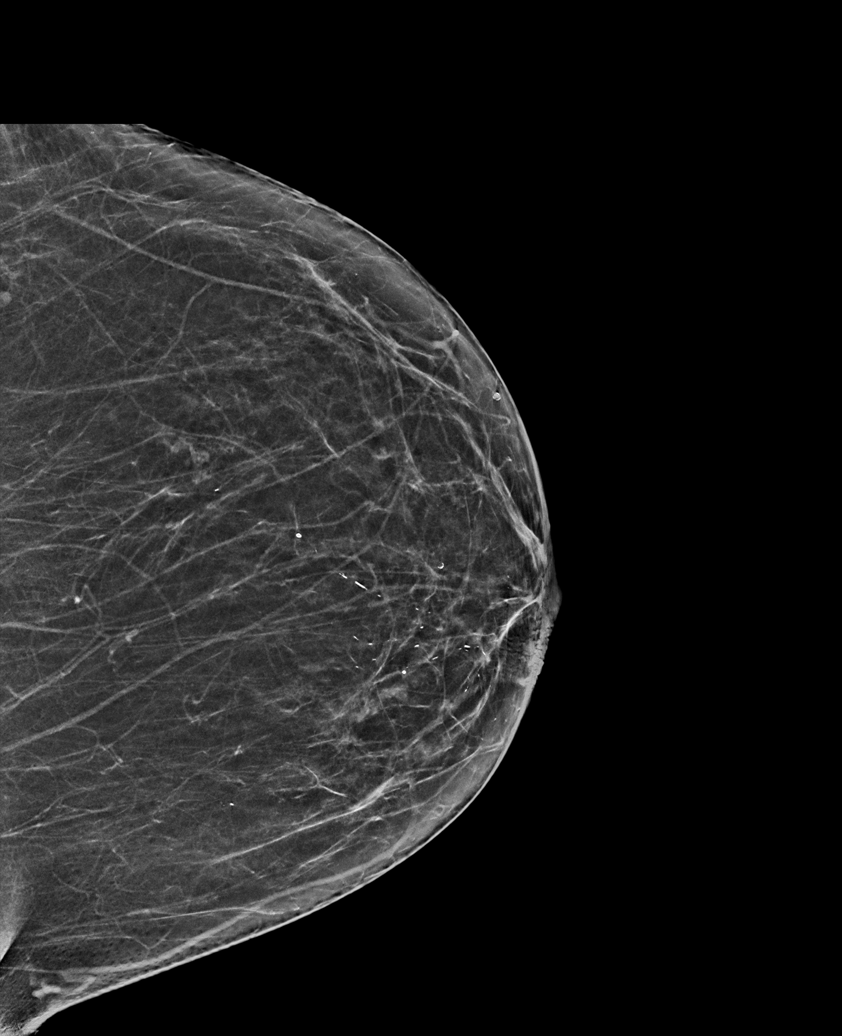

[L MLO synth-2D]
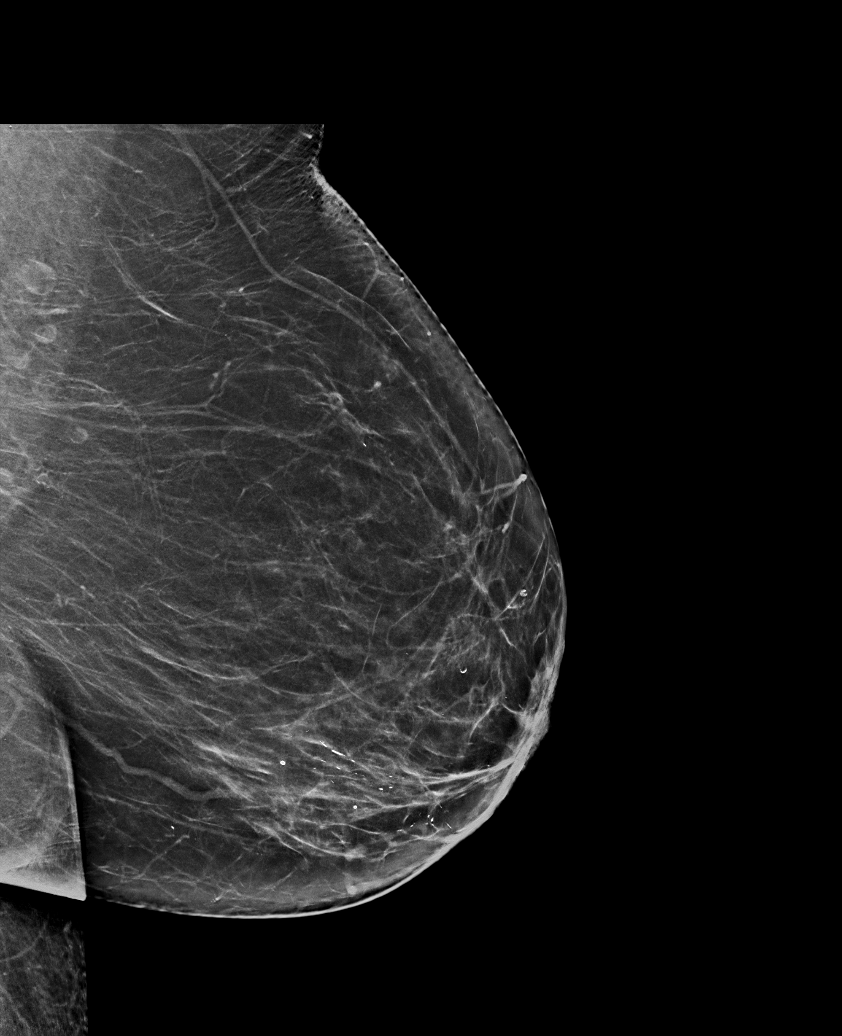

[R XCCL synth-2D]
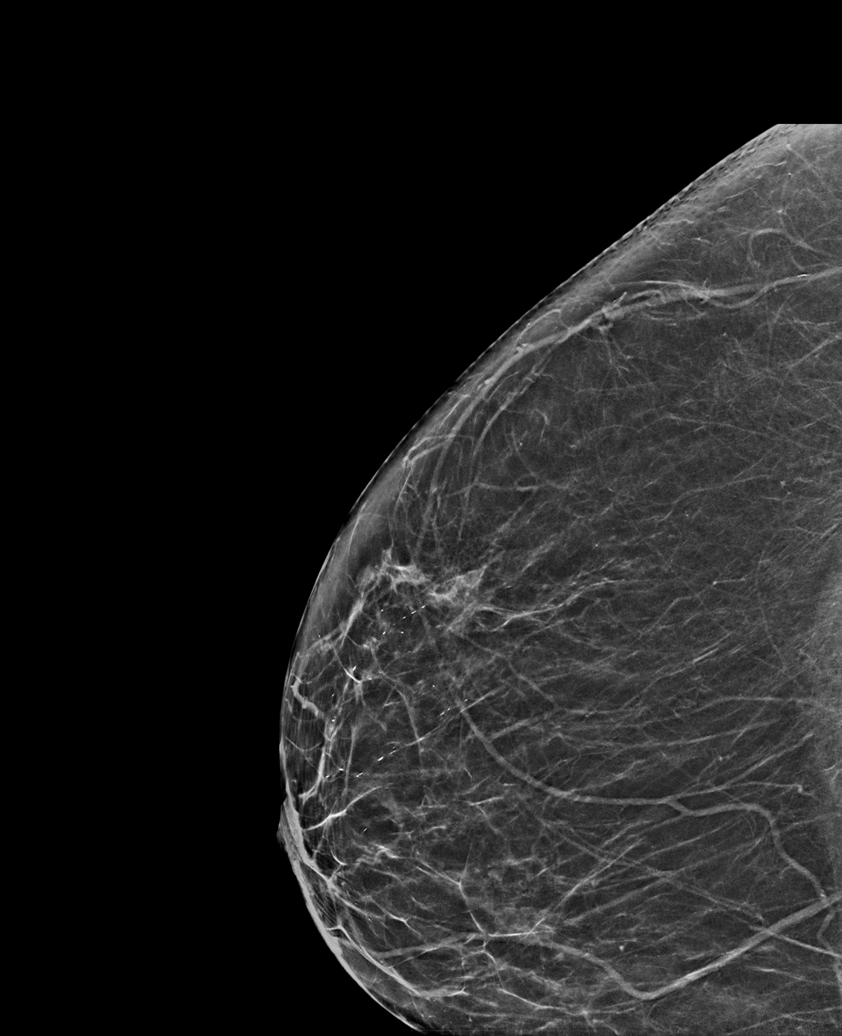

[R CC synth-2D]
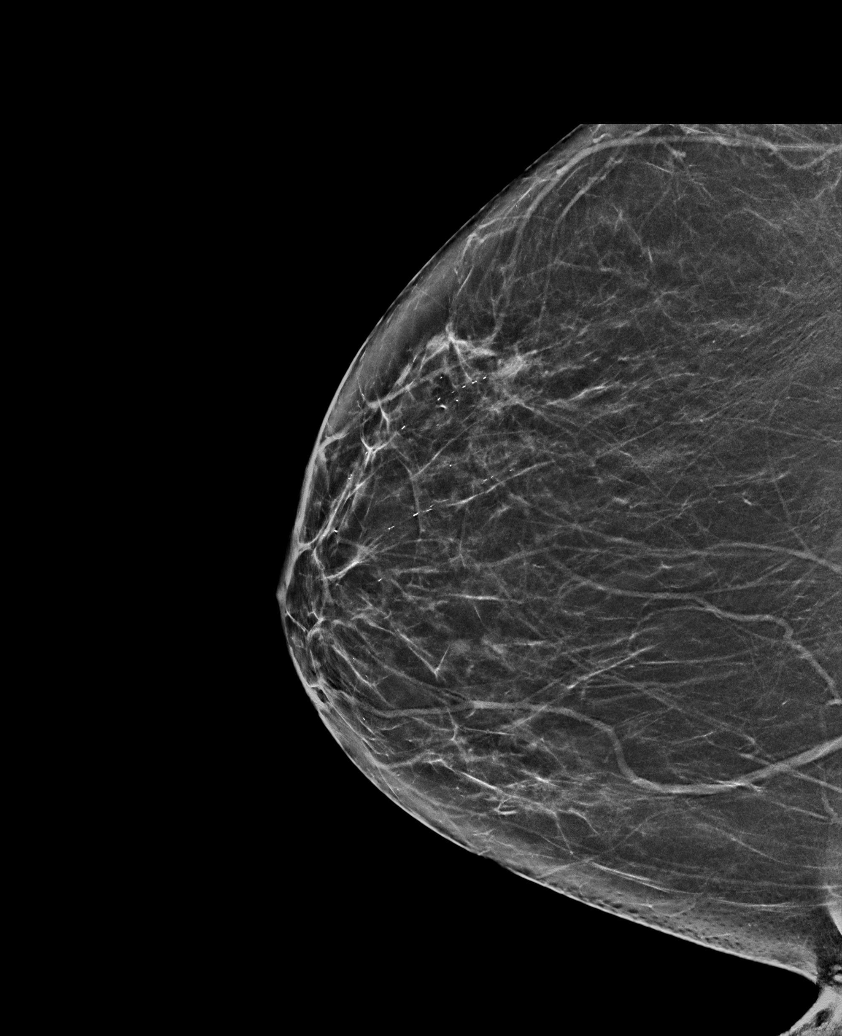

[R XCCL tomo · tomo slice 35/68.0]
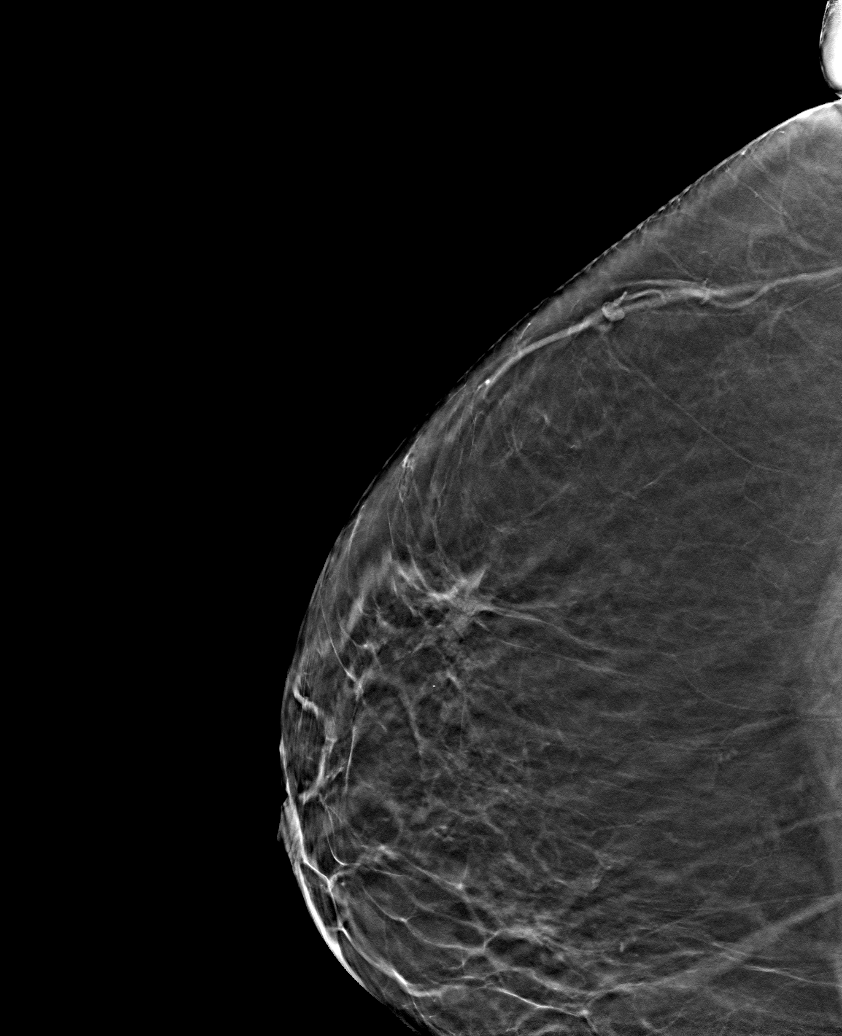

[6 of 30 positions shown; findings below may reference images not displayed]

ACR Breast Density Category b: There are scattered areas of
fibroglandular density.
FINDINGS: There are no findings suspicious for malignancy. Images were
processed with CAD.
IMPRESSION: No mammographic evidence of malignancy. A result letter of this
screening mammogram will be mailed directly to the patient.

RECOMMENDATION:
Screening mammogram in one year. (Code:CN-U-775)

BI-RADS CATEGORY  1: Negative.

## 2021-05-02 ENCOUNTER — Other Ambulatory Visit: Payer: Self-pay | Admitting: Internal Medicine

## 2021-05-02 DIAGNOSIS — Z1231 Encounter for screening mammogram for malignant neoplasm of breast: Secondary | ICD-10-CM

## 2021-06-09 ENCOUNTER — Ambulatory Visit
Admission: RE | Admit: 2021-06-09 | Discharge: 2021-06-09 | Disposition: A | Discharge status: Home / Self Care | Payer: Medicare Other | Source: Ambulatory Visit | Attending: Internal Medicine | Admitting: Internal Medicine

## 2021-06-09 DIAGNOSIS — Z1231 Encounter for screening mammogram for malignant neoplasm of breast: Secondary | ICD-10-CM | POA: Insufficient documentation

## 2022-02-28 ENCOUNTER — Other Ambulatory Visit: Payer: Self-pay

## 2022-02-28 DIAGNOSIS — Z1231 Encounter for screening mammogram for malignant neoplasm of breast: Secondary | ICD-10-CM

## 2022-05-09 ENCOUNTER — Other Ambulatory Visit
Admission: RE | Admit: 2022-05-09 | Discharge: 2022-05-09 | Disposition: A | Discharge status: Home / Self Care | Payer: Medicare Other | Source: Ambulatory Visit | Attending: Internal Medicine | Admitting: Internal Medicine

## 2022-05-09 ENCOUNTER — Other Ambulatory Visit: Payer: Self-pay | Admitting: Internal Medicine

## 2022-05-09 ENCOUNTER — Ambulatory Visit
Admission: RE | Admit: 2022-05-09 | Discharge: 2022-05-09 | Disposition: A | Discharge status: Home / Self Care | Payer: Medicare Other | Source: Ambulatory Visit | Attending: Internal Medicine | Admitting: Internal Medicine

## 2022-05-09 DIAGNOSIS — R0602 Shortness of breath: Secondary | ICD-10-CM | POA: Insufficient documentation

## 2022-05-09 DIAGNOSIS — R7989 Other specified abnormal findings of blood chemistry: Secondary | ICD-10-CM

## 2022-05-09 LAB — D-DIMER, QUANTITATIVE: D-Dimer, Quant: 2.51 ug/mL-FEU — ABNORMAL HIGH (ref 0.00–0.50)

## 2022-05-09 LAB — TROPONIN I (HIGH SENSITIVITY): Troponin I (High Sensitivity): 8 ng/L (ref <18)

## 2022-05-09 MED ORDER — IOHEXOL 350 MG/ML SOLN
75.0000 mL | Freq: Once | INTRAVENOUS | Status: AC | PRN
  Administered 2022-05-09: 75 mL via INTRAVENOUS

## 2022-05-10 ENCOUNTER — Encounter: Payer: Self-pay | Admitting: Internal Medicine

## 2022-05-10 DIAGNOSIS — R7989 Other specified abnormal findings of blood chemistry: Secondary | ICD-10-CM

## 2022-05-10 DIAGNOSIS — R0602 Shortness of breath: Secondary | ICD-10-CM

## 2022-06-11 ENCOUNTER — Ambulatory Visit
Admission: RE | Admit: 2022-06-11 | Discharge: 2022-06-11 | Disposition: A | Discharge status: Home / Self Care | Payer: Medicare Other | Source: Ambulatory Visit | Attending: Internal Medicine | Admitting: Internal Medicine

## 2022-06-11 DIAGNOSIS — Z1231 Encounter for screening mammogram for malignant neoplasm of breast: Secondary | ICD-10-CM | POA: Diagnosis not present

## 2023-06-07 ENCOUNTER — Emergency Department
Admission: EM | Admit: 2023-06-07 | Discharge: 2023-06-07 | Disposition: A | Discharge status: Home / Self Care | Attending: Emergency Medicine | Admitting: Emergency Medicine

## 2023-06-07 ENCOUNTER — Other Ambulatory Visit: Payer: Self-pay

## 2023-06-07 ENCOUNTER — Emergency Department

## 2023-06-07 DIAGNOSIS — M25531 Pain in right wrist: Secondary | ICD-10-CM | POA: Insufficient documentation

## 2023-06-07 DIAGNOSIS — I10 Essential (primary) hypertension: Secondary | ICD-10-CM | POA: Diagnosis not present

## 2023-06-07 DIAGNOSIS — S0990XA Unspecified injury of head, initial encounter: Secondary | ICD-10-CM | POA: Diagnosis not present

## 2023-06-07 DIAGNOSIS — S0003XA Contusion of scalp, initial encounter: Secondary | ICD-10-CM | POA: Insufficient documentation

## 2023-06-07 DIAGNOSIS — M25571 Pain in right ankle and joints of right foot: Secondary | ICD-10-CM | POA: Diagnosis present

## 2023-06-07 DIAGNOSIS — S90121A Contusion of right lesser toe(s) without damage to nail, initial encounter: Secondary | ICD-10-CM | POA: Insufficient documentation

## 2023-06-07 DIAGNOSIS — Z8541 Personal history of malignant neoplasm of cervix uteri: Secondary | ICD-10-CM | POA: Diagnosis not present

## 2023-06-07 DIAGNOSIS — Z7901 Long term (current) use of anticoagulants: Secondary | ICD-10-CM | POA: Diagnosis not present

## 2023-06-07 DIAGNOSIS — W1809XA Striking against other object with subsequent fall, initial encounter: Secondary | ICD-10-CM | POA: Diagnosis not present

## 2023-06-07 DIAGNOSIS — S93401A Sprain of unspecified ligament of right ankle, initial encounter: Secondary | ICD-10-CM | POA: Diagnosis not present

## 2023-06-07 DIAGNOSIS — S60012A Contusion of left thumb without damage to nail, initial encounter: Secondary | ICD-10-CM | POA: Insufficient documentation

## 2023-06-07 NOTE — ED Triage Notes (Signed)
 Pt comes with c/o fall yesterday. Pt states right ankle pain. Pt did hit her head and had 3 golf ball size bumps., Pt states she hit her head on TV cabinet door. Pt is on thinner. Pt denies any loc.   Pt states neck and right wrist pain also.

## 2023-06-07 NOTE — ED Provider Notes (Signed)
 [facility] Provider Note    Event Date/Time   First MD Initiated Contact with Patient 06/07/23 [phone]     (approximate)   History   Fall   HPI  Becky Collier is a 73 y.o. female  with history of PE on Eliquis, hypertension, seizures, cervical cancer, hepatitis C, vertigo and as listed in EMR presents to the emergency department for treatment and evaluation after falling yesterday. She had stood up and then started to feel like she was twirling around really fast, then fell. She hit her head on the door of the entertainment center, twisted her right ankle, and hurt her right wrist. She denies loss of consciousness. She had EMS come help her up and they wrapped her ankle with an ACE bandage, which has helped. She decided to come in today for imaging because she is on Eliquis.      Physical Exam   Triage Vital Signs: ED Triage Vitals  Encounter Vitals Group     BP 06/07/23 0717 (!) 96/56     Systolic BP Percentile --      Diastolic BP Percentile --      Pulse Rate 06/07/23 0717 79     Resp 06/07/23 0717 18     Temp 06/07/23 0717 98 F (36.7 C)     Temp src --      SpO2 06/07/23 0717 95 %     Weight 06/07/23 0713 200 lb (90.7 kg)     Height 06/07/23 0713 5\' 6"  (1.676 m)     Head Circumference --      Peak Flow --      Pain Score 06/07/23 0713 8     Pain Loc --      Pain Education --      Exclude from Growth Chart --     Most recent vital signs: Vitals:   06/07/23 0717  BP: (!) 96/56  Pulse: 79  Resp: 18  Temp: 98 F (36.7 C)  SpO2: 95%    General: Awake, no distress.  CV:  Good peripheral perfusion.  Resp:  Normal effort.  Abd:  No distention.  Other:  Contusion to left thumb. Hematoma over right occipital scalp. Ecchymosis to lateral right ankle and toes. PMS intact.  Bilateral lower extremity nonpitting edema.   ED Results / Procedures / Treatments   Labs (all labs ordered are listed, but only abnormal results are  displayed) Labs Reviewed - No data to display   EKG  Not indicated.  RADIOLOGY  Image and radiology report reviewed and interpreted by me. Radiology report consistent with the same.  CT head and cervical spine negative for acute concerns.  X-ray of right ankle and right wrist negative for acute bony abnormality  PROCEDURES:  Critical Care performed: No  Procedures   MEDICATIONS ORDERED IN ED:  Medications - No data to display   IMPRESSION / MDM / ASSESSMENT AND PLAN / ED COURSE   I have reviewed the triage note.  Differential diagnosis includes, but is not limited to, ICH, cervical vertebral injury, wrist sprain, radius/ulna fracture, ankle sprain, distal tibia/fibula fracture.   Patient's presentation is most consistent with acute presentation with potential threat to life or bodily function.  73 year old female presenting to the emergency department for treatment and evaluation after falling yesterday.  See HPI for further details.  Exam is reassuring.  Patient denies having had dizziness since yesterday.  She states that she takes medication for vertigo every day but it  had not seemed to be that bad usually.  Today, she denies CVA symptoms.  Her neuroexam is reassuring.  CT of the head and cervical spine is negative for acute concerns.  Imaging of the right ankle and the right wrist are negative for bony abnormality.  These results were discussed with the patient. Ankle will be braced with an ASO.  We discussed a more in-depth workup with labs, urinalysis, and MRI of the brain to further investigate the cause of the dizziness.  Patient does not feel that it is necessary because of her vertigo history and lack of residual symptoms.  She was encouraged to return to the emergency department if she begins to experience any weakness or other symptoms of concern. She is agreeable to the plan.     FINAL CLINICAL IMPRESSION(S) / ED DIAGNOSES   Final diagnoses:  Minor head  injury, initial encounter  Right wrist pain  Sprain of right ankle, unspecified ligament, initial encounter     Rx / DC Orders   ED Discharge Orders     None        Note:  This document was prepared using Dragon voice recognition software and may include unintentional dictation errors.   Sherryle Don, FNP 06/07/23 1120    Lubertha Rush, MD 06/08/23 [phone]

## 2023-06-07 NOTE — ED Notes (Signed)
 See triage note  Presents s/p fall yesterday  States she had vertigo yesterday when getting up from the sofa  [place] hitting her head on TV console and twisted right ankle  Presents with ankle aced   states unable to bear full wt

## 2023-06-07 NOTE — Discharge Instructions (Signed)
 Return to the ER for symptoms that change, worsen, or for new concerns if unable to see primary care.  Rest, elevate, and apply ice to your ankle off and on about 20 minutes/hour while awake.

## 2023-09-23 ENCOUNTER — Other Ambulatory Visit: Payer: Self-pay | Admitting: Internal Medicine

## 2023-09-23 DIAGNOSIS — Z1231 Encounter for screening mammogram for malignant neoplasm of breast: Secondary | ICD-10-CM

## 2023-10-11 ENCOUNTER — Ambulatory Visit
Admission: RE | Admit: 2023-10-11 | Discharge: 2023-10-11 | Disposition: A | Discharge status: Home / Self Care | Source: Ambulatory Visit | Attending: Internal Medicine | Admitting: Internal Medicine

## 2023-10-11 DIAGNOSIS — Z1231 Encounter for screening mammogram for malignant neoplasm of breast: Secondary | ICD-10-CM | POA: Diagnosis present
